# Patient Record
Sex: Female | Born: 1990 | Race: White | Hispanic: No | Marital: Married | Smoking: Never smoker
Health system: Southern US, Community
[De-identification: ages and names within clinical notes are randomized; demographics above are authoritative.]

## PROBLEM LIST (undated history)

## (undated) DIAGNOSIS — O139 Gestational [pregnancy-induced] hypertension without significant proteinuria, unspecified trimester: Secondary | ICD-10-CM

## (undated) HISTORY — DX: Gestational (pregnancy-induced) hypertension without significant proteinuria, unspecified trimester: O13.9

## (undated) HISTORY — PX: HIP ARTHROSCOPY W/ LABRAL REPAIR: SHX1750

## (undated) HISTORY — PX: RHINOPLASTY: SUR1284

---

## 2020-01-02 NOTE — L&D Delivery Note (Signed)
Delivery Note At 7:50 PM a viable female was delivered via Vaginal, Spontaneous (Presentation: Left Occiput Anterior).  APGAR: 9, 10; weight pending.   Placenta status: Spontaneous, Intact.  Cord: 3 vessels with the following complications:  none.  Cord pH: not sent She pushed two times to easily deliver a viable female. Moderate uterine atony noted and 1g TXA given with resolution of atony.   Anesthesia: Epidural Episiotomy: None Lacerations: 1st degree Suture Repair: 2.0 3.0 vicryl Est. Blood Loss (mL):  400cc   Mom to postpartum.  Baby to Couplet care / Skin to Skin.  Brandi Mckenzie 09/09/2020, 8:18 PM

## 2020-02-15 LAB — OB RESULTS CONSOLE ANTIBODY SCREEN: Antibody Screen: NEGATIVE

## 2020-02-15 LAB — OB RESULTS CONSOLE GC/CHLAMYDIA
Chlamydia: NEGATIVE
Gonorrhea: NEGATIVE

## 2020-02-15 LAB — OB RESULTS CONSOLE ABO/RH: RH Type: POSITIVE

## 2020-02-15 LAB — OB RESULTS CONSOLE RUBELLA ANTIBODY, IGM: Rubella: NON-IMMUNE/NOT IMMUNE

## 2020-02-15 LAB — OB RESULTS CONSOLE RPR: RPR: NONREACTIVE

## 2020-02-15 LAB — OB RESULTS CONSOLE HEPATITIS B SURFACE ANTIGEN: Hepatitis B Surface Ag: NEGATIVE

## 2020-02-15 LAB — OB RESULTS CONSOLE HIV ANTIBODY (ROUTINE TESTING): HIV: NONREACTIVE

## 2020-04-22 ENCOUNTER — Other Ambulatory Visit: Payer: Self-pay | Admitting: Obstetrics & Gynecology

## 2020-04-22 DIAGNOSIS — N6320 Unspecified lump in the left breast, unspecified quadrant: Secondary | ICD-10-CM

## 2020-05-18 ENCOUNTER — Ambulatory Visit
Admission: RE | Admit: 2020-05-18 | Discharge: 2020-05-18 | Disposition: A | Discharge status: Home / Self Care | Payer: Self-pay | Source: Ambulatory Visit | Attending: Obstetrics & Gynecology | Admitting: Obstetrics & Gynecology

## 2020-05-18 ENCOUNTER — Other Ambulatory Visit: Payer: Self-pay

## 2020-05-18 ENCOUNTER — Other Ambulatory Visit: Payer: Self-pay | Admitting: Obstetrics & Gynecology

## 2020-05-18 DIAGNOSIS — N6320 Unspecified lump in the left breast, unspecified quadrant: Secondary | ICD-10-CM

## 2020-08-22 ENCOUNTER — Other Ambulatory Visit: Payer: Self-pay

## 2020-08-22 ENCOUNTER — Encounter (HOSPITAL_COMMUNITY): Payer: Self-pay | Admitting: Obstetrics & Gynecology

## 2020-08-22 ENCOUNTER — Inpatient Hospital Stay (EMERGENCY_DEPARTMENT_HOSPITAL)
Admission: AD | Admit: 2020-08-22 | Discharge: 2020-08-22 | Disposition: A | Discharge status: Home / Self Care | Payer: BC Managed Care – PPO | Source: Home / Self Care | Attending: Obstetrics & Gynecology | Admitting: Obstetrics & Gynecology

## 2020-08-22 DIAGNOSIS — R030 Elevated blood-pressure reading, without diagnosis of hypertension: Secondary | ICD-10-CM | POA: Diagnosis not present

## 2020-08-22 DIAGNOSIS — Z3A35 35 weeks gestation of pregnancy: Secondary | ICD-10-CM | POA: Insufficient documentation

## 2020-08-22 DIAGNOSIS — O133 Gestational [pregnancy-induced] hypertension without significant proteinuria, third trimester: Secondary | ICD-10-CM | POA: Insufficient documentation

## 2020-08-22 DIAGNOSIS — Z881 Allergy status to other antibiotic agents status: Secondary | ICD-10-CM | POA: Insufficient documentation

## 2020-08-22 DIAGNOSIS — Z880 Allergy status to penicillin: Secondary | ICD-10-CM | POA: Insufficient documentation

## 2020-08-22 LAB — CBC
HCT: 34.7 % — ABNORMAL LOW (ref 36.0–46.0)
Hemoglobin: 11.9 g/dL — ABNORMAL LOW (ref 12.0–15.0)
MCH: 30.8 pg (ref 26.0–34.0)
MCHC: 34.3 g/dL (ref 30.0–36.0)
MCV: 89.9 fL (ref 80.0–100.0)
Platelets: 240 K/uL (ref 150–400)
RBC: 3.86 MIL/uL — ABNORMAL LOW (ref 3.87–5.11)
RDW: 13.1 % (ref 11.5–15.5)
WBC: 11 K/uL — ABNORMAL HIGH (ref 4.0–10.5)
nRBC: 0 % (ref 0.0–0.2)

## 2020-08-22 LAB — COMPREHENSIVE METABOLIC PANEL
ALT: 10 U/L (ref 0–44)
AST: 17 U/L (ref 15–41)
Albumin: 2.3 g/dL — ABNORMAL LOW (ref 3.5–5.0)
Alkaline Phosphatase: 76 U/L (ref 38–126)
Anion gap: 6 (ref 5–15)
BUN: 5 mg/dL — ABNORMAL LOW (ref 6–20)
CO2: 20 mmol/L — ABNORMAL LOW (ref 22–32)
Calcium: 8.5 mg/dL — ABNORMAL LOW (ref 8.9–10.3)
Chloride: 109 mmol/L (ref 98–111)
Creatinine, Ser: 0.62 mg/dL (ref 0.44–1.00)
GFR, Estimated: >60 mL/min (ref >60)
Glucose, Bld: 85 mg/dL (ref 70–99)
Potassium: 3.7 mmol/L (ref 3.5–5.1)
Sodium: 135 mmol/L (ref 135–145)
Total Bilirubin: 0.3 mg/dL (ref 0.3–1.2)
Total Protein: 5.6 g/dL — ABNORMAL LOW (ref 6.5–8.1)

## 2020-08-22 LAB — PROTEIN / CREATININE RATIO, URINE
Creatinine, Urine: 63.67 mg/dL
Total Protein, Urine: <6 mg/dL

## 2020-08-22 NOTE — MAU Provider Note (Signed)
History     CSN: [id number]  Arrival date and time: 08/22/20 1613   Event Date/Time   First Provider Initiated Contact with Patient 08/22/20 1731      Chief Complaint  Patient presents with   Headache   Hypertension   Joint Swelling   HPI This is a 30yo G1P0 at [redacted]w[redacted]d who was sent to MAU from the office due to elevated BP in the office of 158/108. Protein was negative in urine. No palliating or provoking factors.  Good fetal movement. No headache, vision changes.   OB History     Gravida  1   Para  0   Term  0   Preterm  0   AB  0   Living  0      SAB  0   IAB  0   Ectopic  0   Multiple  0   Live Births  0           History reviewed. No pertinent past medical history.  Past Surgical History:  Procedure Laterality Date   HIP ARTHROSCOPY W/ LABRAL REPAIR     RHINOPLASTY      History reviewed. No pertinent family history.  Social History   Tobacco Use   Smoking status: Never   Smokeless tobacco: Never  Vaping Use   Vaping Use: Never used  Substance Use Topics   Alcohol use: Never   Drug use: Never    Allergies:  Allergies  Allergen Reactions   Doxycycline Anaphylaxis   Amoxicillin Rash   Penicillins Rash    Medications Prior to Admission  Medication Sig Dispense Refill Last Dose   lactobacillus acidophilus (BACID) TABS tablet Take 2 tablets by mouth 3 (three) times daily.      Prenatal Vit-Fe Fumarate-FA (PRENATAL MULTIVITAMIN) TABS tablet Take by mouth daily at 12 noon.       Review of Systems  All other systems reviewed and are negative. Physical Exam   Blood pressure (!) 149/98, pulse 87, temperature 98 F (36.7 C), temperature source Oral, resp. rate 20, height 5\' 8"  (1.727 m), weight 89.2 kg, SpO2 98 %.  Patient Vitals for the past 24 hrs:  BP Temp Temp src Pulse Resp SpO2 Height Weight  08/22/20 1845 (!) 149/98 -- -- 87 -- 98 % -- --  08/22/20 1830 (!) 143/95 -- -- 72 -- 97 % -- --  08/22/20 1815 (!) 139/99 -- -- 80  -- 97 % -- --  08/22/20 1800 (!) 137/99 -- -- 76 -- 98 % -- --  08/22/20 1745 (!) 141/101 -- -- 76 -- 98 % -- --  08/22/20 1730 (!) 144/101 -- -- 70 -- 97 % -- --  08/22/20 1715 (!) 151/101 -- -- 70 -- 98 % -- --  08/22/20 1700 (!) 143/103 -- -- 70 -- 97 % -- --  08/22/20 1658 (!) 140/100 -- -- 70 -- -- -- --  08/22/20 1640 (!) 152/104 98 F (36.7 C) Oral 73 20 99 % 5\' 8"  (1.727 m) 89.2 kg     Physical Exam Vitals and nursing note reviewed.  Constitutional:      Appearance: She is well-developed.  HENT:     Head: Normocephalic and atraumatic.  Cardiovascular:     Rate and Rhythm: Normal rate and regular rhythm.     Heart sounds: Normal heart sounds. No murmur heard.   No friction rub. No gallop.  Pulmonary:     Effort: Pulmonary effort is normal. No respiratory distress.  Breath sounds: Normal breath sounds. No wheezing.  Abdominal:     General: Bowel sounds are normal.     Palpations: Abdomen is soft.  Neurological:     Mental Status: She is alert.  Psychiatric:        Mood and Affect: Mood normal. Mood is not anxious or depressed.        Speech: Speech normal.        Behavior: Behavior normal.   Results for orders placed or performed during the hospital encounter of 08/22/20 (from the past 24 hour(s))  Protein / creatinine ratio, urine     Status: None   Collection Time: 08/22/20  4:54 PM  Result Value Ref Range   Creatinine, Urine 63.67 mg/dL   Total Protein, Urine <6 mg/dL   Protein Creatinine Ratio        0.00 - 0.15 mg/mg[Cre]  CBC     Status: Abnormal   Collection Time: 08/22/20  5:08 PM  Result Value Ref Range   WBC 11.0 (H) 4.0 - 10.5 K/uL   RBC 3.86 (L) 3.87 - 5.11 MIL/uL   Hemoglobin 11.9 (L) 12.0 - 15.0 g/dL   HCT 53.2 (L) 99.2 - 42.6 %   MCV 89.9 80.0 - 100.0 fL   MCH 30.8 26.0 - 34.0 pg   MCHC 34.3 30.0 - 36.0 g/dL   RDW 83.4 19.6 - 22.2 %   Platelets 240 150 - 400 K/uL   nRBC 0.0 0.0 - 0.2 %  Comprehensive metabolic panel     Status: Abnormal    Collection Time: 08/22/20  5:08 PM  Result Value Ref Range   Sodium 135 135 - 145 mmol/L   Potassium 3.7 3.5 - 5.1 mmol/L   Chloride 109 98 - 111 mmol/L   CO2 20 (L) 22 - 32 mmol/L   Glucose, Bld 85 70 - 99 mg/dL   BUN 5 (L) 6 - 20 mg/dL   Creatinine, Ser 9.79 0.44 - 1.00 mg/dL   Calcium 8.5 (L) 8.9 - 10.3 mg/dL   Total Protein 5.6 (L) 6.5 - 8.1 g/dL   Albumin 2.3 (L) 3.5 - 5.0 g/dL   AST 17 15 - 41 U/L   ALT 10 0 - 44 U/L   Alkaline Phosphatase 76 38 - 126 U/L   Total Bilirubin 0.3 0.3 - 1.2 mg/dL   GFR, Estimated >89 >21 mL/min   Anion gap 6 5 - 15     MAU Course  Procedures NST: Baseline 130s, mod variability, + accels, no decels   MDM Bps have remained mild range. Labs normal. Stable for discharge to home. Spoke with Dr Langston Masker - they will arrange follow up with the patient.  Assessment and Plan   1. [redacted] weeks gestation of pregnancy   2. Gestational hypertension, third trimester    Discharge to home. Return precautions given, especially severe symptoms.  Levie Heritage 08/22/2020, 6:52 PM

## 2020-08-22 NOTE — MAU Note (Signed)
Noticed last wk or so, BP has been going up. Woke up this mornig 140's /90's. +HA, kind of dizzy and increase in swelling. In office,158/108. Sent in for further eval. Baby still moving- but less. +HA (has not taken anything),"little spotty" with vision,  denies epigastric pain, swollen ankles.

## 2020-08-24 ENCOUNTER — Inpatient Hospital Stay (HOSPITAL_COMMUNITY)
Admission: AD | Admit: 2020-08-24 | Discharge: 2020-08-26 | DRG: 833 | Disposition: A | Discharge status: Home / Self Care | Payer: BC Managed Care – PPO | Attending: Obstetrics and Gynecology | Admitting: Obstetrics and Gynecology

## 2020-08-24 ENCOUNTER — Encounter (HOSPITAL_COMMUNITY): Payer: Self-pay | Admitting: Obstetrics and Gynecology

## 2020-08-24 ENCOUNTER — Inpatient Hospital Stay (HOSPITAL_COMMUNITY): Payer: BC Managed Care – PPO

## 2020-08-24 ENCOUNTER — Other Ambulatory Visit: Payer: Self-pay

## 2020-08-24 DIAGNOSIS — Z20822 Contact with and (suspected) exposure to covid-19: Secondary | ICD-10-CM | POA: Diagnosis present

## 2020-08-24 DIAGNOSIS — O133 Gestational [pregnancy-induced] hypertension without significant proteinuria, third trimester: Principal | ICD-10-CM | POA: Diagnosis present

## 2020-08-24 DIAGNOSIS — O1413 Severe pre-eclampsia, third trimester: Secondary | ICD-10-CM | POA: Diagnosis not present

## 2020-08-24 DIAGNOSIS — O10013 Pre-existing essential hypertension complicating pregnancy, third trimester: Secondary | ICD-10-CM | POA: Diagnosis not present

## 2020-08-24 DIAGNOSIS — R030 Elevated blood-pressure reading, without diagnosis of hypertension: Secondary | ICD-10-CM | POA: Diagnosis present

## 2020-08-24 DIAGNOSIS — Z3A35 35 weeks gestation of pregnancy: Secondary | ICD-10-CM | POA: Diagnosis not present

## 2020-08-24 DIAGNOSIS — O1410 Severe pre-eclampsia, unspecified trimester: Secondary | ICD-10-CM | POA: Diagnosis present

## 2020-08-24 LAB — COMPREHENSIVE METABOLIC PANEL
ALT: 13 U/L (ref 0–44)
AST: 17 U/L (ref 15–41)
Albumin: 2.5 g/dL — ABNORMAL LOW (ref 3.5–5.0)
Alkaline Phosphatase: 77 U/L (ref 38–126)
Anion gap: 10 (ref 5–15)
BUN: 6 mg/dL (ref 6–20)
CO2: 18 mmol/L — ABNORMAL LOW (ref 22–32)
Calcium: 9.2 mg/dL (ref 8.9–10.3)
Chloride: 106 mmol/L (ref 98–111)
Creatinine, Ser: 0.64 mg/dL (ref 0.44–1.00)
GFR, Estimated: >60 mL/min (ref >60)
Glucose, Bld: 140 mg/dL — ABNORMAL HIGH (ref 70–99)
Potassium: 3.8 mmol/L (ref 3.5–5.1)
Sodium: 134 mmol/L — ABNORMAL LOW (ref 135–145)
Total Bilirubin: 0.4 mg/dL (ref 0.3–1.2)
Total Protein: 6 g/dL — ABNORMAL LOW (ref 6.5–8.1)

## 2020-08-24 LAB — TYPE AND SCREEN
ABO/RH(D): O POS
Antibody Screen: NEGATIVE

## 2020-08-24 LAB — CBC
HCT: 39.8 % (ref 36.0–46.0)
Hemoglobin: 13.5 g/dL (ref 12.0–15.0)
MCH: 30.8 pg (ref 26.0–34.0)
MCHC: 33.9 g/dL (ref 30.0–36.0)
MCV: 90.9 fL (ref 80.0–100.0)
Platelets: 255 10*3/uL (ref 150–400)
RBC: 4.38 MIL/uL (ref 3.87–5.11)
RDW: 13.4 % (ref 11.5–15.5)
WBC: 15 10*3/uL — ABNORMAL HIGH (ref 4.0–10.5)
nRBC: 0 % (ref 0.0–0.2)

## 2020-08-24 LAB — PROTEIN / CREATININE RATIO, URINE
Creatinine, Urine: 168.96 mg/dL
Protein Creatinine Ratio: 0.09 mg/mg{Cre} (ref 0.00–0.15)
Total Protein, Urine: 16 mg/dL

## 2020-08-24 LAB — OB RESULTS CONSOLE GBS: GBS: NEGATIVE

## 2020-08-24 LAB — SARS CORONAVIRUS 2 (TAT 6-24 HRS): SARS Coronavirus 2: NEGATIVE

## 2020-08-24 MED ORDER — PRENATAL MULTIVITAMIN CH
1.0000 | ORAL_TABLET | Freq: Every day | ORAL | Status: DC
  Administered 2020-08-24 – 2020-08-26 (×3): 1 via ORAL
  Filled 2020-08-24 (×3): qty 1

## 2020-08-24 MED ORDER — ZOLPIDEM TARTRATE 5 MG PO TABS: 5.0000 mg | ORAL_TABLET | Freq: Every evening | ORAL | Status: DC | PRN

## 2020-08-24 MED ORDER — DOCUSATE SODIUM 100 MG PO CAPS
100.0000 mg | ORAL_CAPSULE | Freq: Every day | ORAL | Status: DC
  Administered 2020-08-24 – 2020-08-26 (×3): 100 mg via ORAL
  Filled 2020-08-24 (×3): qty 1

## 2020-08-24 MED ORDER — CALCIUM CARBONATE ANTACID 500 MG PO CHEW
2.0000 | CHEWABLE_TABLET | ORAL | Status: DC | PRN
  Administered 2020-08-24 – 2020-08-26 (×5): 400 mg via ORAL
  Filled 2020-08-24 (×5): qty 2

## 2020-08-24 MED ORDER — LACTATED RINGERS IV BOLUS
500.0000 mL | Freq: Once | INTRAVENOUS | Status: AC
  Administered 2020-08-24: 500 mL via INTRAVENOUS

## 2020-08-24 MED ORDER — BETAMETHASONE SOD PHOS & ACET 6 (3-3) MG/ML IJ SUSP
12.0000 mg | INTRAMUSCULAR | Status: AC
  Administered 2020-08-24 – 2020-08-25 (×2): 12 mg via INTRAMUSCULAR
  Filled 2020-08-24: qty 5

## 2020-08-24 MED ORDER — ACETAMINOPHEN 325 MG PO TABS
650.0000 mg | ORAL_TABLET | ORAL | Status: DC | PRN
  Administered 2020-08-24: 650 mg via ORAL
  Filled 2020-08-24: qty 2

## 2020-08-24 MED ORDER — PRENATAL MULTIVITAMIN CH: 1.0000 | ORAL_TABLET | Freq: Every day | ORAL | Status: DC

## 2020-08-24 MED ORDER — LACTATED RINGERS IV SOLN: INTRAVENOUS | Status: DC

## 2020-08-24 NOTE — Progress Notes (Signed)
Patient without complaints. BP (!) 150/91 (BP Location: Right Arm)   Pulse 66   Temp 98.5 F (36.9 C) (Oral)   Resp 18   SpO2 99%  Results for orders placed or performed during the hospital encounter of 08/24/20 (from the past 24 hour(s))  Protein / creatinine ratio, urine     Status: None   Collection Time: 08/24/20  1:56 PM  Result Value Ref Range   Creatinine, Urine 168.96 mg/dL   Total Protein, Urine 16 mg/dL   Protein Creatinine Ratio 0.09 0.00 - 0.15 mg/mg[Cre]   Ultrasound pending  Will obtain cbc and cmet  Mfm consult ordered Steroid series started Continue inpatient monitoring for now

## 2020-08-24 NOTE — H&P (Signed)
Brandi Mckenzie is a 30 y.o. G 1 P 0 at 35 w 3 days presents for management of hypertension BP noted to be very elevated 2 days ago with 3 + proteinuria  Sent home and symptoms have worsened. OB History     Gravida  1   Para  0   Term  0   Preterm  0   AB  0   Living  0      SAB  0   IAB  0   Ectopic  0   Multiple  0   Live Births  0          No past medical history on file. Past Surgical History:  Procedure Laterality Date   HIP ARTHROSCOPY W/ LABRAL REPAIR     RHINOPLASTY     Family History: family history is not on file. Social History:  reports that she has never smoked. She has never used smokeless tobacco. She reports that she does not drink alcohol and does not use drugs.     Maternal Diabetes: No Genetic Screening: Normal Maternal Ultrasounds/Referrals: Normal Fetal Ultrasounds or other Referrals:  Referred to Materal Fetal Medicine  Maternal Substance Abuse:  No Significant Maternal Medications:  None Significant Maternal Lab Results:  None Other Comments:  None  Review of Systems  All other systems reviewed and are negative. History   There were no vitals taken for this visit. Exam Physical Exam Constitutional:      Appearance: Normal appearance.  HENT:     Head: Normocephalic.  Eyes:     Pupils: Pupils are equal, round, and reactive to light.  Cardiovascular:     Rate and Rhythm: Normal rate and regular rhythm.  Pulmonary:     Effort: Pulmonary effort is normal.  Musculoskeletal:     Cervical back: Normal range of motion.  Neurological:     Mental Status: She is alert.    Prenatal labs: ABO, Rh:   Antibody:   Rubella:   RPR:    HBsAg:    HIV:    GBS:     Assessment/Plan: IUP at 35 w 3 days Preeclampsia  Admit  Steroids MFM consult with ultrasound Labs   Jeani Hawking 08/24/2020, 12:28 PM

## 2020-08-25 DIAGNOSIS — Z3A35 35 weeks gestation of pregnancy: Secondary | ICD-10-CM | POA: Diagnosis not present

## 2020-08-25 DIAGNOSIS — O10013 Pre-existing essential hypertension complicating pregnancy, third trimester: Secondary | ICD-10-CM | POA: Diagnosis not present

## 2020-08-25 DIAGNOSIS — O1413 Severe pre-eclampsia, third trimester: Secondary | ICD-10-CM

## 2020-08-25 NOTE — Consult Note (Signed)
MFM Note  Brandi Mckenzie is a 30 year old gravida 1 para 0 currently at 35 weeks and 4 days.  She was admitted 2 days ago due to elevated blood pressures with 3+ proteinuria noted on urinalysis.  Her blood pressures on admission were all in the 140s to 150s over 100s range.  Her PIH labs were all within normal limits. Her P/C ratio was 0.09.  Other than some swelling, she denies any symptoms related to preeclampsia.  She does not have a history of hypertension.  Her fetal status has been reassuring with a reactive NST.  She reports feeling fetal movements throughout the day.  Due to suspected preeclampsia, she received a complete course of antenatal corticosteroids.  She had an ultrasound performed yesterday that showed an EFW of 5 pounds 13 ounces (43rd percentile). There was normal amniotic fluid with a total AFI of 16 cm.  The fetus was in the vertex presentation.  Since she has been on bedrest in the hospital, her blood pressures have been within normal limits in the 120s to 130s over 70s to 80s range.  She continues to remain asymptomatic.  She denies any significant past medical history.  Her past surgical history includes hip surgery and nose surgery-all related to sport injuries.  The implications and management of preeclampsia was discussed with the patient.  She was advised that preeclampsia can affect both the mother and the fetus.  In the mother, preeclampsia may cause a rise in blood pressures and it can affect the mother's kidney, liver and platelet functions.  It may also cause the mother to have seizures.  In the fetus, it may cause growth restriction and oligohydramnios.  She understands that delivery is the only treatment for preeclampsia.  The patient was advised that the diagnosis of preeclampsia remains uncertain as her P/C ratio did not indicate significant proteinuria.  However, she did have significant proteinuria based on the 3+ protein on her urinalysis.  As the diagnosis of  preeclampsia remains uncertain, we will collect a 24-hour urine today to determine if she meets the criteria for preeclampsia.  Should her 24-hour urine show 300 mg or more of protein, confirming the diagnosis of preeclampsia, delivery is recommended at her current gestational age.  Should her 24-hour urine show less than 300 mg of protein, indicating that she does not have preeclampsia but has gestational hypertension, delivery is recommended at around 37 weeks.  Should preeclampsia be ruled out, she may be discharged home with twice-weekly fetal testing and blood pressure checks in your office.  Her induction should be scheduled at 37 weeks.  The patient stated that all of her questions have been answered to her complete satisfaction.    Thank you for referring this patient for a Maternal-Fetal Medicine consultation.  Recommendations:  24-hour urine collection  Should the diagnosis of preeclampsia be confirmed, delivery is recommended  Should the diagnosis of preeclampsia be ruled out, outpatient management with twice-weekly fetal testing and blood pressure checks with delivery at 37 weeks

## 2020-08-25 NOTE — Progress Notes (Signed)
Antepartum Progress Note   Brandi Mckenzie is a 30 y.o. female G1P0 that is admitted to [facility] for preeclampsia.  Overnight, she reports no acute events.  Admits fetal movement, denies contractions, denies leakage of fluid, denies vaginal bleeding. She denies headache, vision changes, RUQ or epigastric pain.  History   Blood pressure 139/79, pulse 86, temperature 97.9 F (36.6 C), temperature source Oral, resp. rate 19, height 5\' 8"  (1.727 m), weight 88.5 kg, SpO2 99 %. Exam  Physical Exam: Gen: Alert, well appearing, no distress Chest: nonlabored breathing CV: no peripheral edema Abdomen: gravid, soft and nontender Ext: no evidence of DVT  FHT: reactive and reasssuring. Moderate variability. Accels present, no decels.   Assessment/Plan: Admitted to [facility] for preeclampsia BP normotensive to mild range, no antihypertensives required. PIH labs WNL. P/C ratio WNL.  OB [facility] read pending EFM and toco continuous BMZ 8/24, 8/25 Diet: reg DVT Ppx: SCDs    9/25 08/25/2020, 7:18 AM

## 2020-08-26 LAB — PROTEIN, URINE, 24 HOUR
Collection Interval-UPROT: 24 hours
Protein, 24H Urine: 240 mg/d — ABNORMAL HIGH (ref 50–100)
Protein, Urine: 6 mg/dL
Urine Total Volume-UPROT: 4000 mL

## 2020-08-26 LAB — CULTURE, BETA STREP (GROUP B ONLY)

## 2020-08-26 NOTE — Discharge Instructions (Signed)
Call MD for HA unrelieved by Tylenol, chest pain or shortness of breath, visual changes, or right upper quadrant abdominal pain.  Also, if decreased fetal movement of CTX.  Office will call on Monday to schedule appointment for Monday.

## 2020-08-26 NOTE — Progress Notes (Signed)
24 h urine has returned at 240mg .  Patient continues to have no pre-eclampsia symptoms.  NST reactive Will discharge home and have patient f/u twice weekly for the remainder of her pregnancy with planned IOL no later than 37 weeks.    , DO

## 2020-08-26 NOTE — Progress Notes (Addendum)
Antepartum Progress Note Patient denies HA, vision change, RUQ pain, CP/SOB.  Active FM.  Was able to sleep last night.     History Vitals:   08/26/20 0428 08/26/20 0755  BP: 124/79 138/75  Pulse: 65 76  Resp: 18 18  Temp: 98.1 F (36.7 C) 97.9 F (36.6 C)  SpO2: 97% 100%    Exam   Physical Exam: Gen: Alert, well appearing, no distress Chest: nonlabored breathing CV: 1+ edema b/l LE Abdomen: gravid, soft and nontender Ext: no evidence of DVT  U/S yesterday with appropriate fetal growth at 5#13 (43%), normal fluid (AFI 16).  Vertex presentation  FHT: reactive and reasssuring. Moderate variability. Accels present, no decels.     Assessment/Plan: 30yo G1 at [redacted]w[redacted]d with gestational hypertension  Admitted to [facility] for preeclampsia BP normotensive to mild range, no antihypertensives required. PIH labs WNL. P/C ratio WNL.  Awaiting results of 24 hour urine collection (up at 1400).  If >300 mg, will move to IOL.  If <300 mg, will discharge home for close outpatient management and delivery by 37 weeks. BMZ 8/24, 8/25 Diet: reg DVT Ppx: SCDs  Mitchel Honour, DO

## 2020-08-26 NOTE — Discharge Summary (Signed)
Physician Discharge Summary  Patient ID: Brandi Mckenzie MRN: [id number] DOB/AGE: 04-18-1990 30 y.o.  Admit date: 08/24/2020 Discharge date: 08/26/2020  Admission Diagnoses: Elevated blood pressure in third trimester of pregnancy  Discharge Diagnoses: Gestational hypertension  Discharged Condition: good  Hospital Course: Patient was admitted on 8/24 with newly elevated blood pressures and proteinuria.  She denied symptoms of pre-eclampsia.  Blood pressures were 140s to 150s over 100s range.  Her PIH labs were all within normal limits. Her P/C ratio was 0.09.  She received betamethasone but did not require anti-hypertensives.  U/S showed appropriate fetal growth at the 43% and normal fluid; fetal monitoring was reassuring during admission.  MFM consult was requested and Dr. Parke Poisson recommended 24 hour urine collection which returned at 240 mg.  Given dx of GHTN, patient was discharged home with twice weekly NST and follow up for the remainder of pregnancy and planned IOL by 37 weeks.    Consults:  MFM  Significant Diagnostic Studies: labs: 24 hour urine 240 mg  Treatments: BMZ  Discharge Exam: Blood pressure (!) 151/91, pulse (!) 54, temperature 98.6 F (37 C), resp. rate 18, height 5\' 8"  (1.727 m), weight 88 kg, SpO2 100 %. General appearance: alert, cooperative, and appears stated age Extremities: extremities normal, atraumatic, no cyanosis or edema Abd: gravid, soft and non-tender  Disposition: Discharge disposition: 01-Home or Self Care        Allergies as of 08/26/2020       Reactions   Doxycycline Anaphylaxis   Amoxicillin Rash   Penicillins Rash        Medication List     TAKE these medications    lactobacillus acidophilus Tabs tablet Take 2 tablets by mouth 3 (three) times daily.   prenatal multivitamin Tabs tablet Take by mouth daily at 12 noon.         Signed: 08/28/2020 08/26/2020, 5:35 PM

## 2020-08-29 ENCOUNTER — Inpatient Hospital Stay (HOSPITAL_COMMUNITY)
Admission: AD | Admit: 2020-08-29 | Discharge: 2020-08-29 | Disposition: A | Discharge status: Home / Self Care | Payer: BC Managed Care – PPO | Attending: Obstetrics and Gynecology | Admitting: Obstetrics and Gynecology

## 2020-08-29 ENCOUNTER — Other Ambulatory Visit: Payer: Self-pay

## 2020-08-29 ENCOUNTER — Inpatient Hospital Stay (HOSPITAL_BASED_OUTPATIENT_CLINIC_OR_DEPARTMENT_OTHER): Payer: BC Managed Care – PPO

## 2020-08-29 ENCOUNTER — Encounter (HOSPITAL_COMMUNITY): Payer: Self-pay | Admitting: Obstetrics and Gynecology

## 2020-08-29 DIAGNOSIS — O1413 Severe pre-eclampsia, third trimester: Secondary | ICD-10-CM

## 2020-08-29 DIAGNOSIS — O26893 Other specified pregnancy related conditions, third trimester: Secondary | ICD-10-CM | POA: Insufficient documentation

## 2020-08-29 DIAGNOSIS — Z79899 Other long term (current) drug therapy: Secondary | ICD-10-CM | POA: Insufficient documentation

## 2020-08-29 DIAGNOSIS — O139 Gestational [pregnancy-induced] hypertension without significant proteinuria, unspecified trimester: Secondary | ICD-10-CM

## 2020-08-29 DIAGNOSIS — R519 Headache, unspecified: Secondary | ICD-10-CM | POA: Insufficient documentation

## 2020-08-29 DIAGNOSIS — Z3A36 36 weeks gestation of pregnancy: Secondary | ICD-10-CM

## 2020-08-29 DIAGNOSIS — O133 Gestational [pregnancy-induced] hypertension without significant proteinuria, third trimester: Secondary | ICD-10-CM | POA: Diagnosis not present

## 2020-08-29 LAB — PROTEIN / CREATININE RATIO, URINE
Creatinine, Urine: 105.3 mg/dL
Protein Creatinine Ratio: 0.09 mg/mg{Cre} (ref 0.00–0.15)
Total Protein, Urine: 10 mg/dL

## 2020-08-29 LAB — URINALYSIS, ROUTINE W REFLEX MICROSCOPIC
Bilirubin Urine: NEGATIVE
Glucose, UA: NEGATIVE mg/dL
Hgb urine dipstick: NEGATIVE
Ketones, ur: NEGATIVE mg/dL
Leukocytes,Ua: NEGATIVE
Nitrite: NEGATIVE
Protein, ur: NEGATIVE mg/dL
Specific Gravity, Urine: 1.015 (ref 1.005–1.030)
pH: 7 (ref 5.0–8.0)

## 2020-08-29 LAB — COMPREHENSIVE METABOLIC PANEL
ALT: 14 U/L (ref 0–44)
AST: 20 U/L (ref 15–41)
Albumin: 2.4 g/dL — ABNORMAL LOW (ref 3.5–5.0)
Alkaline Phosphatase: 84 U/L (ref 38–126)
Anion gap: 9 (ref 5–15)
BUN: 7 mg/dL (ref 6–20)
CO2: 18 mmol/L — ABNORMAL LOW (ref 22–32)
Calcium: 8.6 mg/dL — ABNORMAL LOW (ref 8.9–10.3)
Chloride: 108 mmol/L (ref 98–111)
Creatinine, Ser: 0.7 mg/dL (ref 0.44–1.00)
GFR, Estimated: >60 mL/min (ref >60)
Glucose, Bld: 130 mg/dL — ABNORMAL HIGH (ref 70–99)
Potassium: 3.8 mmol/L (ref 3.5–5.1)
Sodium: 135 mmol/L (ref 135–145)
Total Bilirubin: 0.3 mg/dL (ref 0.3–1.2)
Total Protein: 6 g/dL — ABNORMAL LOW (ref 6.5–8.1)

## 2020-08-29 LAB — CBC
HCT: 36.4 % (ref 36.0–46.0)
Hemoglobin: 12.4 g/dL (ref 12.0–15.0)
MCH: 31.1 pg (ref 26.0–34.0)
MCHC: 34.1 g/dL (ref 30.0–36.0)
MCV: 91.2 fL (ref 80.0–100.0)
Platelets: 235 10*3/uL (ref 150–400)
RBC: 3.99 MIL/uL (ref 3.87–5.11)
RDW: 13.6 % (ref 11.5–15.5)
WBC: 14.5 10*3/uL — ABNORMAL HIGH (ref 4.0–10.5)
nRBC: 0 % (ref 0.0–0.2)

## 2020-08-29 IMAGING — US US MFM FETAL BPP W/O NON-STRESS
1 series · 12 of 28 positions shown · non-contrast
Comparison: none

[Series 1: us mfm fetal bpp w/o non-stress · 38 acquisitions, 12 frames shown]
[im 2/38]
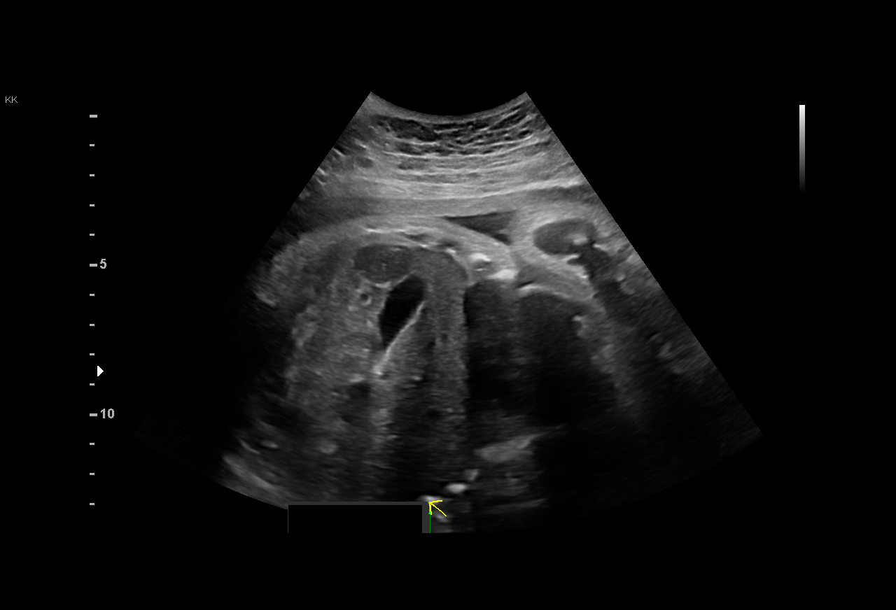
[im 5/38]
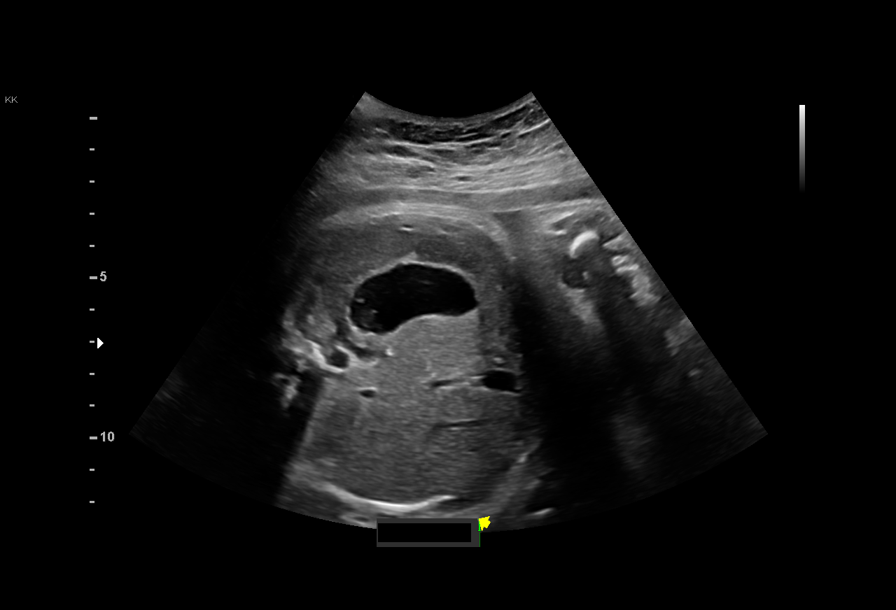
[im 7/38]
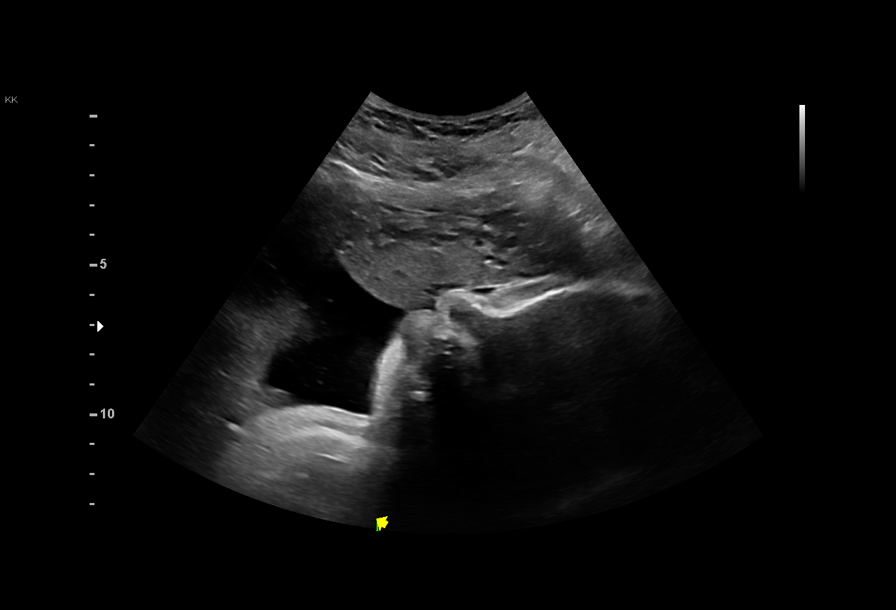
[im 11/38]
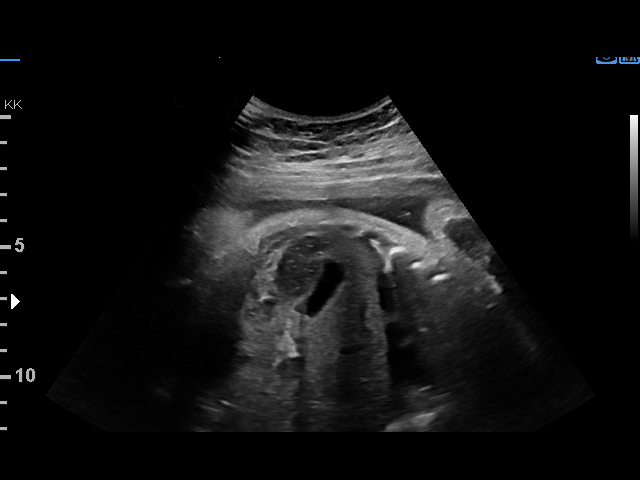
[im 14/38]
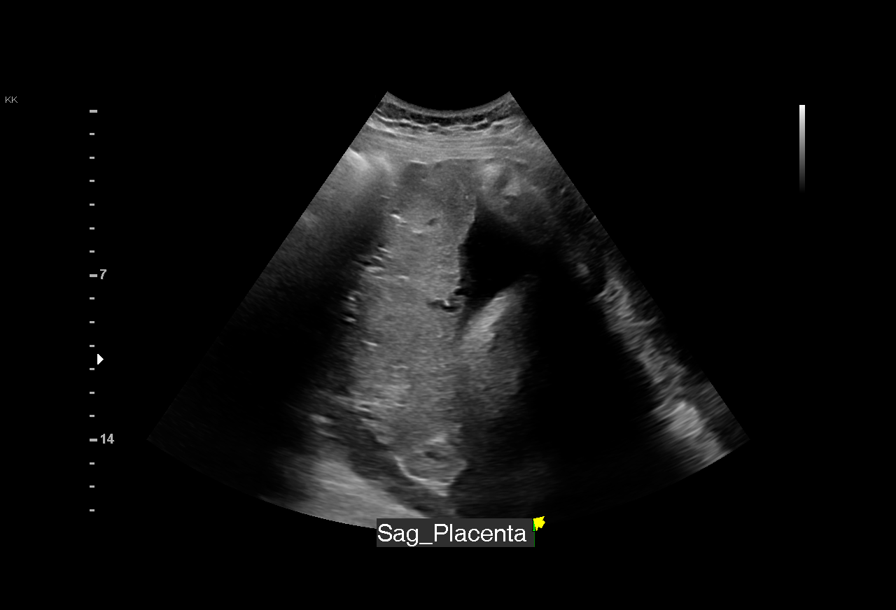
[im 17/38]
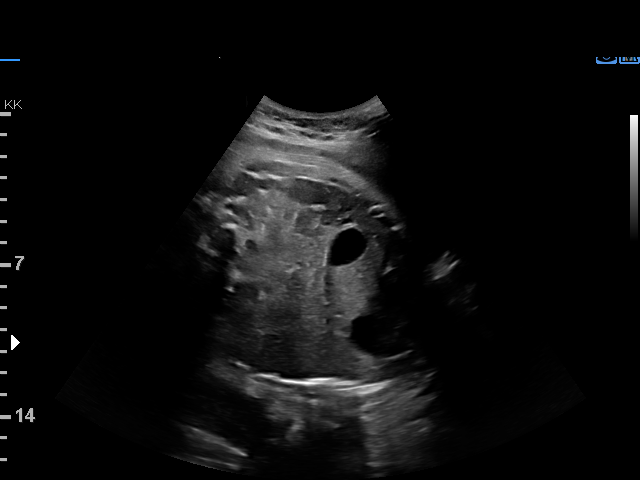
[im 21/38]
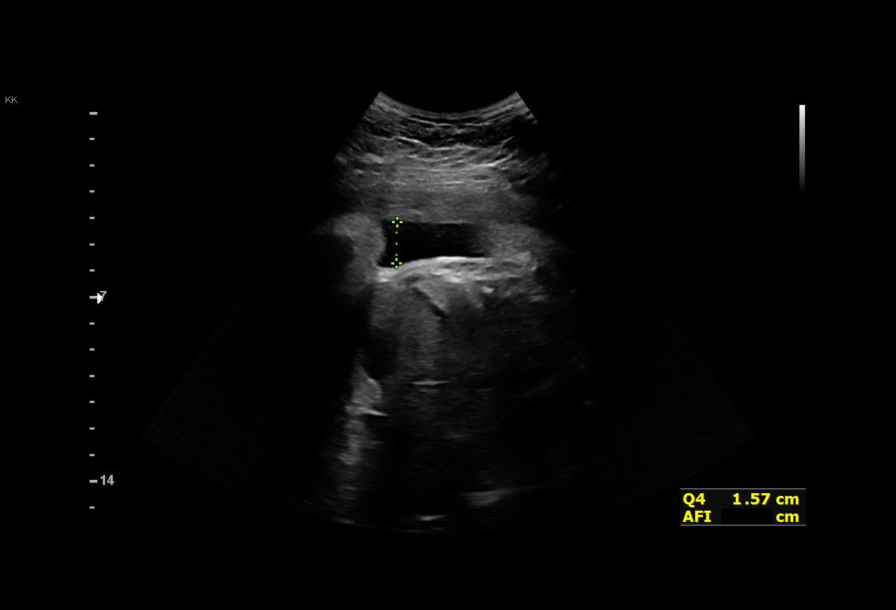
[im 24/38]
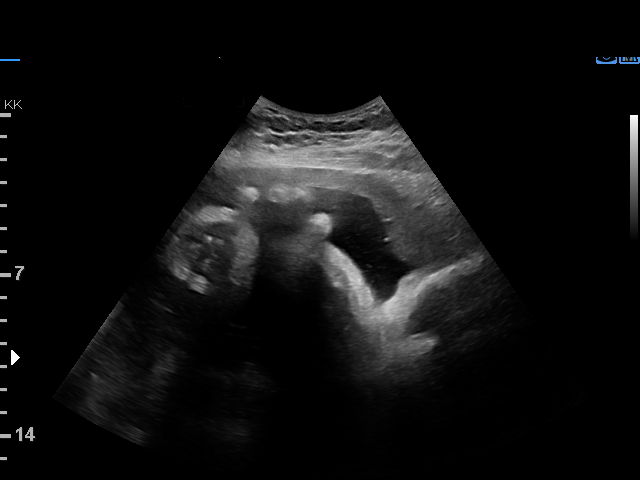
[im 27/38]
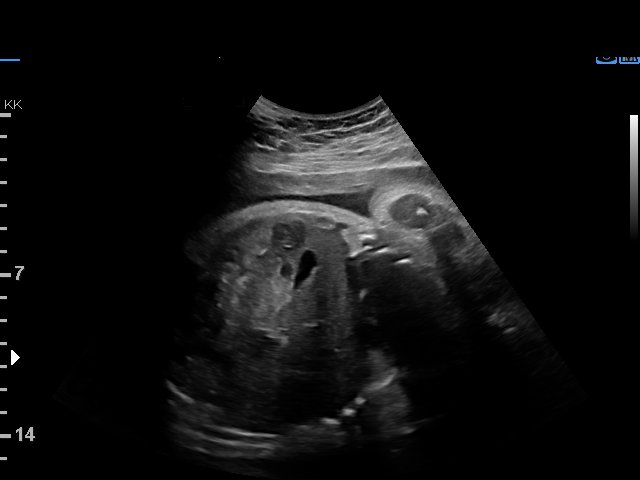
[im 31/38]
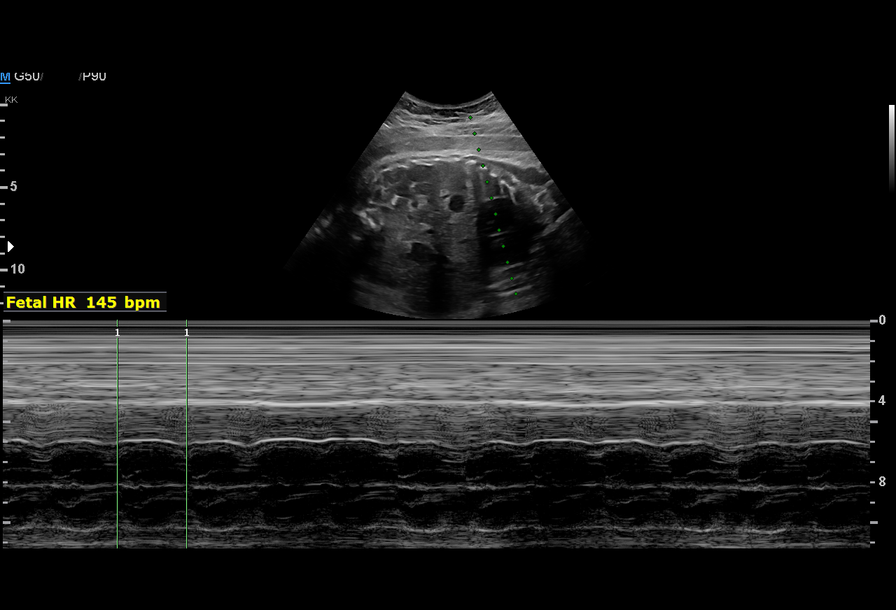
[im 33/38]
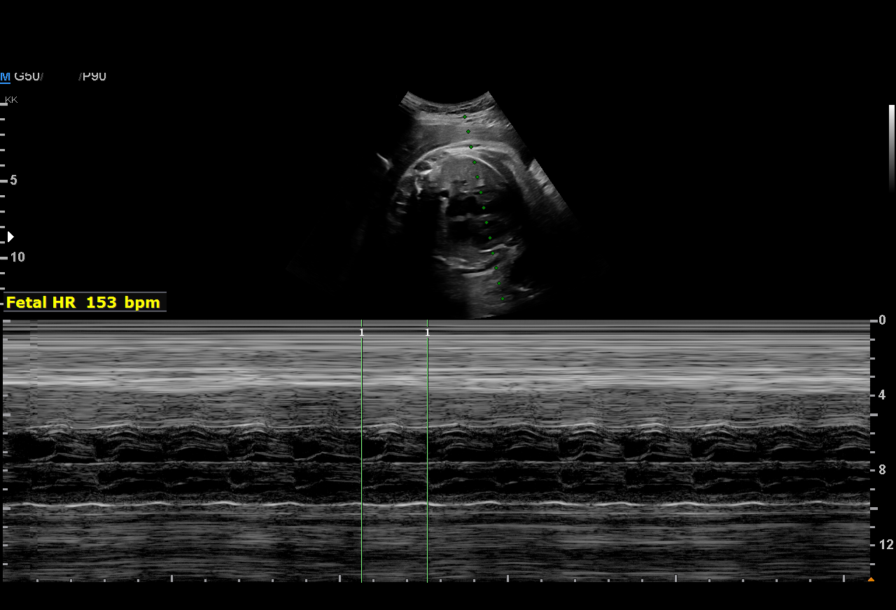
[im 36/38]
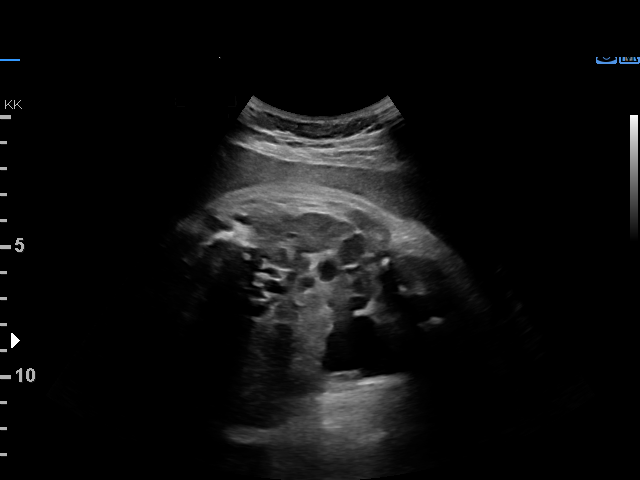

[12 of 28 positions shown; findings below may reference images not displayed]

#300

Indications

 Severe preeclampsia, third trimester           [04]
 36 weeks gestation of pregnancy
Fetal Evaluation

 Num Of Fetuses:         1
 Fetal Heart Rate(bpm):  166
 Cardiac Activity:       Observed
 Presentation:           Cephalic
 Placenta:               Left lateral

 Amniotic Fluid
 AFI FV:      Within normal limits

 AFI Sum(cm)     %Tile       Largest Pocket(cm)
 14.5            53

 RUQ(cm)       RLQ(cm)       LUQ(cm)        LLQ(cm)
 3.5           1.6           3
Biophysical Evaluation

 Amniotic F.V:   Pocket => 2 cm             F. Tone:        Observed
 F. Movement:    Observed                   Score:          [DATE]
 F. Breathing:   Observed
Gestational Age
 Clinical EDD:  36w 1d                                        EDD:   [DATE]
 Best:          36w 1d     Det. By:  Clinical EDD             EDD:   [DATE]
Anatomy

 Stomach:               Visualized             Bladder:                Visualized
Comments

 This patient was sent from the office to the [facility] due to
 elevated blood pressures.
 A biophysical profile performed today was [DATE].
 There was normal amniotic fluid noted on today's ultrasound
 exam.

## 2020-08-29 MED ORDER — LABETALOL HCL 5 MG/ML IV SOLN: 40.0000 mg | INTRAVENOUS | Status: DC | PRN

## 2020-08-29 MED ORDER — ACETAMINOPHEN 500 MG PO TABS
1000.0000 mg | ORAL_TABLET | Freq: Once | ORAL | Status: AC
  Administered 2020-08-29: 1000 mg via ORAL
  Filled 2020-08-29: qty 2

## 2020-08-29 MED ORDER — LABETALOL HCL 5 MG/ML IV SOLN: 20.0000 mg | INTRAVENOUS | Status: DC | PRN

## 2020-08-29 MED ORDER — LABETALOL HCL 5 MG/ML IV SOLN: 80.0000 mg | INTRAVENOUS | Status: DC | PRN

## 2020-08-29 MED ORDER — HYDRALAZINE HCL 20 MG/ML IJ SOLN
10.0000 mg | INTRAMUSCULAR | Status: DC | PRN
Start: 2020-08-29 — End: 2020-08-29

## 2020-08-29 NOTE — Discharge Instructions (Signed)
Be seen in the office on Wednesday for BP check Return with any worsening symptoms - headache, Right upper quadrant pain, decreased fetal movement, or swelling

## 2020-08-29 NOTE — MAU Provider Note (Signed)
History     CSN: [id number]  Arrival date and time: 08/29/20 1258   Event Date/Time   First Provider Initiated Contact with Patient 08/29/20 1327      CC:  Headache and elevated BP - sent from the office  HPI Brandi Mckenzie 30 y.o. [redacted]w[redacted]d Sent from the office today due to elevated BP.  Was admitted for hypertension in pregnancy and was discharged last week.  Today was at the office for a BP check and BP was elevated.  Has a headache today but has not taken any tylenol for headache today.   OB History     Gravida  1   Para  0   Term  0   Preterm  0   AB  0   Living  0      SAB  0   IAB  0   Ectopic  0   Multiple  0   Live Births  0           History reviewed. No pertinent past medical history.  Past Surgical History:  Procedure Laterality Date   HIP ARTHROSCOPY W/ LABRAL REPAIR     RHINOPLASTY      History reviewed. No pertinent family history.  Social History   Tobacco Use   Smoking status: Never   Smokeless tobacco: Never  Vaping Use   Vaping Use: Never used  Substance Use Topics   Alcohol use: Never   Drug use: Never    Allergies:  Allergies  Allergen Reactions   Doxycycline Anaphylaxis   Amoxicillin Rash   Penicillins Rash    Medications Prior to Admission  Medication Sig Dispense Refill Last Dose   lactobacillus acidophilus (BACID) TABS tablet Take 2 tablets by mouth 3 (three) times daily.      Prenatal Vit-Fe Fumarate-FA (PRENATAL MULTIVITAMIN) TABS tablet Take by mouth daily at 12 noon.       Review of Systems  Constitutional:  Negative for fever.  Respiratory:  Negative for shortness of breath and wheezing.   Gastrointestinal:  Negative for abdominal pain.  Genitourinary:  Negative for dysuria, vaginal bleeding and vaginal discharge.  Neurological:  Positive for headaches. Negative for dizziness.  Physical Exam   Blood pressure (!) 156/98, pulse 94, temperature 98.4 F (36.9 C), temperature source Oral, resp. rate 18,  height 5\' 8"  (1.727 m), weight 88 kg, SpO2 98 %.  Physical Exam Vitals and nursing note reviewed.  Constitutional:      Appearance: She is well-developed.  HENT:     Head: Normocephalic.  Cardiovascular:     Rate and Rhythm: Regular rhythm.  Pulmonary:     Effort: Pulmonary effort is normal.  Abdominal:     Palpations: Abdomen is soft.     Tenderness: There is no abdominal tenderness. There is no guarding or rebound.     Comments: No RUQ pain  FHT baseline 155 with moderate variability and 15x15 accels noted One deceleration noted with quick return to baseline. No contractions. Reactive NST  Musculoskeletal:        General: Normal range of motion.     Cervical back: Neck supple.     Comments: Trace edema in ankles Hands are slightly swollen  Skin:    General: Skin is warm and dry.  Neurological:     Mental Status: She is alert and oriented to person, place, and time.  Psychiatric:        Mood and Affect: Mood normal.  Behavior: Behavior normal.        Thought Content: Thought content normal.   Vitals:   08/29/20 1431 08/29/20 1445 08/29/20 1501 08/29/20 1516  BP: (!) 152/98 (!) 154/101 (!) 154/99 (!) 147/100  Pulse: 100 (!) 106 (!) 104 (!) 103  Temp:      Resp:      Height:      Weight:      SpO2:  97%    TempSrc:      BMI (Calculated):        MAU Course  Procedures LABS Results for orders placed or performed during the hospital encounter of 08/29/20 (from the past 24 hour(s))  Urinalysis, Routine w reflex microscopic Urine, Clean Catch     Status: None   Collection Time: 08/29/20  1:13 PM  Result Value Ref Range   Color, Urine YELLOW YELLOW   APPearance CLEAR CLEAR   Specific Gravity, Urine 1.015 1.005 - 1.030   pH 7.0 5.0 - 8.0   Glucose, UA NEGATIVE NEGATIVE mg/dL   Hgb urine dipstick NEGATIVE NEGATIVE   Bilirubin Urine NEGATIVE NEGATIVE   Ketones, ur NEGATIVE NEGATIVE mg/dL   Protein, ur NEGATIVE NEGATIVE mg/dL   Nitrite NEGATIVE NEGATIVE    Leukocytes,Ua NEGATIVE NEGATIVE  CBC     Status: Abnormal   Collection Time: 08/29/20  1:30 PM  Result Value Ref Range   WBC 14.5 (H) 4.0 - 10.5 K/uL   RBC 3.99 3.87 - 5.11 MIL/uL   Hemoglobin 12.4 12.0 - 15.0 g/dL   HCT 25.0 03.7 - 04.8 %   MCV 91.2 80.0 - 100.0 fL   MCH 31.1 26.0 - 34.0 pg   MCHC 34.1 30.0 - 36.0 g/dL   RDW 88.9 16.9 - 45.0 %   Platelets 235 150 - 400 K/uL   nRBC 0.0 0.0 - 0.2 %  Comprehensive metabolic panel     Status: Abnormal   Collection Time: 08/29/20  1:30 PM  Result Value Ref Range   Sodium 135 135 - 145 mmol/L   Potassium 3.8 3.5 - 5.1 mmol/L   Chloride 108 98 - 111 mmol/L   CO2 18 (L) 22 - 32 mmol/L   Glucose, Bld 130 (H) 70 - 99 mg/dL   BUN 7 6 - 20 mg/dL   Creatinine, Ser 3.88 0.44 - 1.00 mg/dL   Calcium 8.6 (L) 8.9 - 10.3 mg/dL   Total Protein 6.0 (L) 6.5 - 8.1 g/dL   Albumin 2.4 (L) 3.5 - 5.0 g/dL   AST 20 15 - 41 U/L   ALT 14 0 - 44 U/L   Alkaline Phosphatase 84 38 - 126 U/L   Total Bilirubin 0.3 0.3 - 1.2 mg/dL   GFR, Estimated >82 >80 mL/min   Anion gap 9 5 - 15  Protein / creatinine ratio, urine     Status: None   Collection Time: 08/29/20  1:37 PM  Result Value Ref Range   Creatinine, Urine 105.30 mg/dL   Total Protein, Urine 10 mg/dL   Protein Creatinine Ratio 0.09 0.00 - 0.15 mg/mg[Cre]     MDM One severe range BP on arrival to MAU.  Other BPs elevated but not severe range. Had betamethasone 8/24 and 8/25 Tylenol PO given for headache today and headache lessened to almost gone Labs for pre-eclampsia normal Had one deceleration on strip and Dr. Adrian Blackwater recommended BPP which was 8/8. Reviewed plan with Dr. Adrian Blackwater who recommended discharge with close follow up. Reviewed plan with Dr. Idalia Needle by phone -  agrees with plan - to see patient in office on Wednesday.   Assessment and Plan  Gestational hypertension with one severe range pressure  Normal labs - no preeclampsia Reactive NST  Plan Will discharge Be seen in the  office on Wednesday for BP check Reviewed signs to return - worse headache (almost resolved today), RUQ pain, worsening edema - trace ankle edema today, or severe range pressure at home 160/110 either value.  Cecelia Graciano L Anirudh Baiz 08/29/2020, 1:51 PM

## 2020-08-29 NOTE — MAU Note (Signed)
Brandi Mckenzie is a 30 y.o. at [redacted]w[redacted]d here in MAU reporting: was sent over from the office due to elevated BP and HA. Feels like she can't catch her breath. +FM.  Onset of complaint: today  Pain score: 5/10  Vitals:   08/29/20 1314  BP: (!) 160/108  Pulse: (!) 117  Resp: 18  Temp: 98.4 F (36.9 C)  SpO2: 98%     FHT:156  Lab orders placed from triage: UA

## 2020-09-02 ENCOUNTER — Telehealth (HOSPITAL_COMMUNITY): Payer: Self-pay | Admitting: *Deleted

## 2020-09-02 ENCOUNTER — Encounter (HOSPITAL_COMMUNITY): Payer: Self-pay | Admitting: *Deleted

## 2020-09-02 NOTE — Telephone Encounter (Signed)
Preadmission screen  

## 2020-09-08 ENCOUNTER — Other Ambulatory Visit: Payer: Self-pay

## 2020-09-08 NOTE — H&P (Signed)
Brandi Mckenzie is a 30 y.o. female presenting for IOL s/s gestation hypertension without severe features. She was diagnosed at 36 weeks, admitted, given BMZ, and discharged in stable condition. She has been monitored without medication and with reassuring antenatal testing as an outpatient until now.  [place] labs have been reassuring. She has used adderall this pregnancy and has reassuring growth.   OB History     Gravida  1   Para  0   Term  0   Preterm  0   AB  0   Living  0      SAB  0   IAB  0   Ectopic  0   Multiple  0   Live Births  0          Past Medical History:  Diagnosis Date   Pregnancy induced hypertension    Past Surgical History:  Procedure Laterality Date   HIP ARTHROSCOPY W/ LABRAL REPAIR     RHINOPLASTY     Family History: family history is not on file. Social History:  reports that she has never smoked. She has never used smokeless tobacco. She reports that she does not drink alcohol and does not use drugs.     Maternal Diabetes: No Genetic Screening: Normal Maternal Ultrasounds/Referrals: Normal Fetal Ultrasounds or other Referrals:  None Maternal Substance Abuse:  No Significant Maternal Medications:  Meds include: Other:  Adderral Significant Maternal Lab Results:  Group B Strep negative Other Comments:  None  Review of Systems History   There were no vitals taken for this visit. Exam Physical Exam  (from office) NAD, A&O NWOB Abd soft, nondistended, gravid  Prenatal labs: ABO, Rh: --/--/O POS (08/24 1745) Antibody: NEG (08/24 1745) Rubella: Nonimmune (02/14 0000) RPR: Nonreactive (02/14 0000)  HBsAg: Negative (02/14 0000)  HIV: Non-reactive (02/14 0000)  GBS:   Negative on 08/24/20  Assessment/Plan: 30 yo G1P0 @ 37.5wga presenting for IOL s/s gHTN. Has declined SVE since 56 wga but at that time was unfavorable. Plan for cytotec followed by pitocin/AROM when more favorable.  Mag prn severe features. GBS negative.  Rubella  non-immune - needs MMR pp.    Colin [place] Mackie 09/08/2020, 1:26 PM

## 2020-09-09 ENCOUNTER — Inpatient Hospital Stay (HOSPITAL_COMMUNITY): Payer: BC Managed Care – PPO | Admitting: Anesthesiology

## 2020-09-09 ENCOUNTER — Encounter (HOSPITAL_COMMUNITY): Payer: Self-pay | Admitting: Obstetrics and Gynecology

## 2020-09-09 ENCOUNTER — Inpatient Hospital Stay (HOSPITAL_COMMUNITY): Payer: BC Managed Care – PPO

## 2020-09-09 ENCOUNTER — Inpatient Hospital Stay (HOSPITAL_COMMUNITY)
Admission: AD | Admit: 2020-09-09 | Discharge: 2020-09-11 | DRG: 807 | Disposition: A | Discharge status: Home / Self Care | Payer: BC Managed Care – PPO | Attending: Obstetrics and Gynecology | Admitting: Obstetrics and Gynecology

## 2020-09-09 DIAGNOSIS — O134 Gestational [pregnancy-induced] hypertension without significant proteinuria, complicating childbirth: Principal | ICD-10-CM | POA: Diagnosis present

## 2020-09-09 DIAGNOSIS — Z20822 Contact with and (suspected) exposure to covid-19: Secondary | ICD-10-CM | POA: Diagnosis present

## 2020-09-09 DIAGNOSIS — Z3A37 37 weeks gestation of pregnancy: Secondary | ICD-10-CM | POA: Diagnosis not present

## 2020-09-09 DIAGNOSIS — O139 Gestational [pregnancy-induced] hypertension without significant proteinuria, unspecified trimester: Secondary | ICD-10-CM | POA: Diagnosis present

## 2020-09-09 LAB — COMPREHENSIVE METABOLIC PANEL
ALT: 13 U/L (ref 0–44)
AST: 18 U/L (ref 15–41)
Albumin: 2.4 g/dL — ABNORMAL LOW (ref 3.5–5.0)
Alkaline Phosphatase: 90 U/L (ref 38–126)
Anion gap: 9 (ref 5–15)
BUN: 12 mg/dL (ref 6–20)
CO2: 19 mmol/L — ABNORMAL LOW (ref 22–32)
Calcium: 9.9 mg/dL (ref 8.9–10.3)
Chloride: 104 mmol/L (ref 98–111)
Creatinine, Ser: 0.62 mg/dL (ref 0.44–1.00)
GFR, Estimated: >60 mL/min (ref >60)
Glucose, Bld: 80 mg/dL (ref 70–99)
Potassium: 4.4 mmol/L (ref 3.5–5.1)
Sodium: 132 mmol/L — ABNORMAL LOW (ref 135–145)
Total Bilirubin: 0.4 mg/dL (ref 0.3–1.2)
Total Protein: 5.7 g/dL — ABNORMAL LOW (ref 6.5–8.1)

## 2020-09-09 LAB — CBC
HCT: 38.2 % (ref 36.0–46.0)
HCT: 40.4 % (ref 36.0–46.0)
Hemoglobin: 13 g/dL (ref 12.0–15.0)
Hemoglobin: 13.4 g/dL (ref 12.0–15.0)
MCH: 31 pg (ref 26.0–34.0)
MCH: 30.5 pg (ref 26.0–34.0)
MCHC: 34 g/dL (ref 30.0–36.0)
MCHC: 33.2 g/dL (ref 30.0–36.0)
MCV: 91 fL (ref 80.0–100.0)
MCV: 92 fL (ref 80.0–100.0)
Platelets: 277 10*3/uL (ref 150–400)
Platelets: 290 10*3/uL (ref 150–400)
RBC: 4.2 MIL/uL (ref 3.87–5.11)
RBC: 4.39 MIL/uL (ref 3.87–5.11)
RDW: 12.9 % (ref 11.5–15.5)
RDW: 12.9 % (ref 11.5–15.5)
WBC: 12.9 10*3/uL — ABNORMAL HIGH (ref 4.0–10.5)
WBC: 16.2 10*3/uL — ABNORMAL HIGH (ref 4.0–10.5)
nRBC: 0 % (ref 0.0–0.2)
nRBC: 0 % (ref 0.0–0.2)

## 2020-09-09 LAB — RPR: RPR Ser Ql: NONREACTIVE

## 2020-09-09 LAB — TYPE AND SCREEN
ABO/RH(D): O POS
Antibody Screen: NEGATIVE

## 2020-09-09 LAB — RESP PANEL BY RT-PCR (FLU A&B, COVID) ARPGX2
Influenza A by PCR: NEGATIVE
Influenza B by PCR: NEGATIVE
SARS Coronavirus 2 by RT PCR: POSITIVE — AB

## 2020-09-09 MED ORDER — LACTATED RINGERS IV SOLN
500.0000 mL | Freq: Once | INTRAVENOUS | Status: AC
  Administered 2020-09-09: 500 mL via INTRAVENOUS

## 2020-09-09 MED ORDER — OXYTOCIN BOLUS FROM INFUSION: 333.0000 mL | Freq: Once | INTRAVENOUS | Status: DC

## 2020-09-09 MED ORDER — OXYTOCIN-SODIUM CHLORIDE 30-0.9 UT/500ML-% IV SOLN
2.5000 [IU]/h | INTRAVENOUS | Status: DC
  Filled 2020-09-09: qty 500

## 2020-09-09 MED ORDER — PHENYLEPHRINE 40 MCG/ML (10ML) SYRINGE FOR IV PUSH (FOR BLOOD PRESSURE SUPPORT): 80.0000 ug | PREFILLED_SYRINGE | INTRAVENOUS | Status: DC | PRN

## 2020-09-09 MED ORDER — MISOPROSTOL 25 MCG QUARTER TABLET
25.0000 ug | ORAL_TABLET | ORAL | Status: DC | PRN
  Administered 2020-09-09 (×3): 25 ug via VAGINAL
  Filled 2020-09-09 (×4): qty 1

## 2020-09-09 MED ORDER — HYDROXYZINE HCL 50 MG PO TABS: 50.0000 mg | ORAL_TABLET | Freq: Four times a day (QID) | ORAL | Status: DC | PRN

## 2020-09-09 MED ORDER — ONDANSETRON HCL 4 MG PO TABS: 4.0000 mg | ORAL_TABLET | ORAL | Status: DC | PRN

## 2020-09-09 MED ORDER — EPHEDRINE 5 MG/ML INJ: 10.0000 mg | INTRAVENOUS | Status: DC | PRN

## 2020-09-09 MED ORDER — LIDOCAINE HCL (PF) 1 % IJ SOLN
INTRAMUSCULAR | Status: DC | PRN
  Administered 2020-09-09: 5 mL via EPIDURAL
  Administered 2020-09-09: 4 mL via EPIDURAL

## 2020-09-09 MED ORDER — LACTATED RINGERS IV SOLN: INTRAVENOUS | Status: DC

## 2020-09-09 MED ORDER — ONDANSETRON HCL 4 MG/2ML IJ SOLN: 4.0000 mg | Freq: Four times a day (QID) | INTRAMUSCULAR | Status: DC | PRN

## 2020-09-09 MED ORDER — MISOPROSTOL 50MCG HALF TABLET
50.0000 ug | ORAL_TABLET | ORAL | Status: DC
  Administered 2020-09-09: 50 ug via ORAL

## 2020-09-09 MED ORDER — IBUPROFEN 600 MG PO TABS
600.0000 mg | ORAL_TABLET | Freq: Four times a day (QID) | ORAL | Status: DC
  Administered 2020-09-09 – 2020-09-11 (×6): 600 mg via ORAL
  Filled 2020-09-09 (×6): qty 1

## 2020-09-09 MED ORDER — BUTORPHANOL TARTRATE 1 MG/ML IJ SOLN
1.0000 mg | INTRAMUSCULAR | Status: DC | PRN
  Administered 2020-09-09: 1 mg via INTRAVENOUS
  Filled 2020-09-09: qty 1

## 2020-09-09 MED ORDER — TERBUTALINE SULFATE 1 MG/ML IJ SOLN: 0.2500 mg | Freq: Once | INTRAMUSCULAR | Status: DC | PRN

## 2020-09-09 MED ORDER — ACETAMINOPHEN 325 MG PO TABS
650.0000 mg | ORAL_TABLET | ORAL | Status: DC | PRN
  Administered 2020-09-10: 650 mg via ORAL
  Filled 2020-09-09: qty 2

## 2020-09-09 MED ORDER — OXYCODONE-ACETAMINOPHEN 5-325 MG PO TABS
1.0000 | ORAL_TABLET | ORAL | Status: DC | PRN
Start: 2020-09-09 — End: 2020-09-09

## 2020-09-09 MED ORDER — DIBUCAINE (PERIANAL) 1 % EX OINT: 1.0000 "application " | TOPICAL_OINTMENT | CUTANEOUS | Status: DC | PRN

## 2020-09-09 MED ORDER — SOD CITRATE-CITRIC ACID 500-334 MG/5ML PO SOLN
30.0000 mL | ORAL | Status: DC | PRN
  Administered 2020-09-09: 30 mL via ORAL
  Filled 2020-09-09: qty 30

## 2020-09-09 MED ORDER — SIMETHICONE 80 MG PO CHEW: 80.0000 mg | CHEWABLE_TABLET | ORAL | Status: DC | PRN

## 2020-09-09 MED ORDER — LACTATED RINGERS IV SOLN: 500.0000 mL | INTRAVENOUS | Status: DC | PRN

## 2020-09-09 MED ORDER — COCONUT OIL OIL
1.0000 "application " | TOPICAL_OIL | Status: DC | PRN
  Administered 2020-09-10: 1 via TOPICAL

## 2020-09-09 MED ORDER — WITCH HAZEL-GLYCERIN EX PADS: 1.0000 "application " | MEDICATED_PAD | CUTANEOUS | Status: DC | PRN

## 2020-09-09 MED ORDER — LIDOCAINE HCL (PF) 1 % IJ SOLN: 30.0000 mL | INTRAMUSCULAR | Status: DC | PRN

## 2020-09-09 MED ORDER — ONDANSETRON HCL 4 MG/2ML IJ SOLN: 4.0000 mg | INTRAMUSCULAR | Status: DC | PRN

## 2020-09-09 MED ORDER — MISOPROSTOL 50MCG HALF TABLET
ORAL_TABLET | ORAL | Status: AC
  Filled 2020-09-09: qty 1

## 2020-09-09 MED ORDER — ZOLPIDEM TARTRATE 5 MG PO TABS: 5.0000 mg | ORAL_TABLET | Freq: Every evening | ORAL | Status: DC | PRN

## 2020-09-09 MED ORDER — OXYCODONE HCL 5 MG PO TABS: 5.0000 mg | ORAL_TABLET | ORAL | Status: DC | PRN

## 2020-09-09 MED ORDER — FENTANYL-BUPIVACAINE-NACL 0.5-0.125-0.9 MG/250ML-% EP SOLN
12.0000 mL/h | EPIDURAL | Status: DC | PRN
  Administered 2020-09-09: 12 mL/h via EPIDURAL
  Filled 2020-09-09: qty 250

## 2020-09-09 MED ORDER — OXYCODONE HCL 5 MG PO TABS: 10.0000 mg | ORAL_TABLET | ORAL | Status: DC | PRN

## 2020-09-09 MED ORDER — PRENATAL MULTIVITAMIN CH
1.0000 | ORAL_TABLET | Freq: Every day | ORAL | Status: DC
  Administered 2020-09-10: 1 via ORAL
  Filled 2020-09-09: qty 1

## 2020-09-09 MED ORDER — ACETAMINOPHEN 325 MG PO TABS: 650.0000 mg | ORAL_TABLET | ORAL | Status: DC | PRN

## 2020-09-09 MED ORDER — OXYCODONE-ACETAMINOPHEN 5-325 MG PO TABS
2.0000 | ORAL_TABLET | ORAL | Status: DC | PRN
Start: 2020-09-09 — End: 2020-09-09

## 2020-09-09 MED ORDER — MEASLES, MUMPS & RUBELLA VAC IJ SOLR: 0.5000 mL | Freq: Once | INTRAMUSCULAR | Status: DC

## 2020-09-09 MED ORDER — DIPHENHYDRAMINE HCL 50 MG/ML IJ SOLN
12.5000 mg | INTRAMUSCULAR | Status: DC | PRN
Start: 2020-09-09 — End: 2020-09-09

## 2020-09-09 MED ORDER — OXYTOCIN-SODIUM CHLORIDE 30-0.9 UT/500ML-% IV SOLN: 1.0000 m[IU]/min | INTRAVENOUS | Status: DC

## 2020-09-09 MED ORDER — TRANEXAMIC ACID-NACL 1000-0.7 MG/100ML-% IV SOLN: 1000.0000 mg | Freq: Once | INTRAVENOUS | Status: DC

## 2020-09-09 MED ORDER — DIPHENHYDRAMINE HCL 25 MG PO CAPS: 25.0000 mg | ORAL_CAPSULE | Freq: Four times a day (QID) | ORAL | Status: DC | PRN

## 2020-09-09 MED ORDER — BENZOCAINE-MENTHOL 20-0.5 % EX AERO: 1.0000 "application " | INHALATION_SPRAY | CUTANEOUS | Status: DC | PRN

## 2020-09-09 MED ORDER — ERYTHROMYCIN 5 MG/GM OP OINT
TOPICAL_OINTMENT | OPHTHALMIC | Status: AC
  Filled 2020-09-09: qty 1

## 2020-09-09 MED ORDER — SENNOSIDES-DOCUSATE SODIUM 8.6-50 MG PO TABS
2.0000 | ORAL_TABLET | ORAL | Status: DC
  Administered 2020-09-10: 2 via ORAL
  Filled 2020-09-09: qty 2

## 2020-09-09 MED ORDER — TRANEXAMIC ACID-NACL 1000-0.7 MG/100ML-% IV SOLN
INTRAVENOUS | Status: AC
  Filled 2020-09-09: qty 100

## 2020-09-09 NOTE — Anesthesia Procedure Notes (Signed)
Epidural Patient location during procedure: OB Start time: 09/09/2020 6:38 PM End time: 09/09/2020 6:46 PM  Staffing Anesthesiologist: Mal Amabile, MD Performed: anesthesiologist   Preanesthetic Checklist Completed: patient identified, IV checked, site marked, risks and benefits discussed, surgical consent, monitors and equipment checked, pre-op evaluation and timeout performed  Epidural Patient position: sitting Prep: DuraPrep and site prepped and draped Patient monitoring: continuous pulse ox and blood pressure Approach: midline Location: L3-L4 Injection technique: LOR air  Needle:  Needle type: Tuohy  Needle gauge: 18 G Needle length: 9 cm and 9 Needle insertion depth: 4 cm Catheter type: closed end flexible Catheter size: 19 Gauge Catheter at skin depth: 9 cm Test dose: negative and Other  Assessment Events: blood not aspirated, injection not painful, no injection resistance, no paresthesia and negative IV test  Additional Notes Patient identified. Risks and benefits discussed including failed block, incomplete  Pain control, post dural puncture headache, nerve damage, paralysis, blood pressure Changes, nausea, vomiting, reactions to medications-both toxic and allergic and post Partum back pain. All questions were answered. Patient expressed understanding and wished to proceed. Sterile technique was used throughout procedure. Epidural site was Dressed with sterile barrier dressing. No paresthesias, signs of intravascular injection Or signs of intrathecal spread were encountered.  Patient was more comfortable after the epidural was dosed. Please see RN's note for documentation of vital signs and FHR which are stable.]Reason for block:procedure for pain

## 2020-09-09 NOTE — Lactation Note (Signed)
This note was copied from a baby's chart. Lactation Consultation Note  Patient Name: Brandi Mckenzie [id number] Date: 09/09/2020   Age:32 hours  LC talked with tech on first floor trying to reach RN in L &D. RN stated working with Mom at moment and prefer to see lactation once mother is on the floor.   Maternal Data    Feeding    LATCH Score                    Lactation Tools Discussed/Used    Interventions    Discharge    Consult Status      [name]  [name] 09/09/2020, 8:56 PM

## 2020-09-09 NOTE — Progress Notes (Signed)
Late entry s/s prenatal care. No updates to H&P except + at home covid test and on precautions.  SVE deferred - not yet due for next cytotec.

## 2020-09-09 NOTE — Progress Notes (Signed)
Feels well - no s/s severe PIH. BP 140s/90s SVE 1.5-2/60/-2, ballotable.  Placed 4th cytotec buccally FHT reassuring PIH labs reassuring

## 2020-09-09 NOTE — Anesthesia Preprocedure Evaluation (Addendum)
Anesthesia Evaluation  Patient identified by MRN, date of birth, ID band Patient awake    Reviewed: Allergy & Precautions, Patient's Chart, lab work & pertinent test results  Airway Mallampati: II  TM Distance: >3 FB Neck ROM: Full    Dental no notable dental hx. (+) Teeth Intact   Pulmonary  Covid 19 + at home test, on precautions   Pulmonary exam normal breath sounds clear to auscultation       Cardiovascular hypertension, Normal cardiovascular exam Rhythm:Regular Rate:Normal     Neuro/Psych negative neurological ROS  negative psych ROS   GI/Hepatic Neg liver ROS, GERD  Medicated and Controlled,  Endo/Other  negative endocrine ROS  Renal/GU negative Renal ROS  negative genitourinary   Musculoskeletal negative musculoskeletal ROS (+)   Abdominal   Peds  Hematology negative hematology ROS (+)   Anesthesia Other Findings   Reproductive/Obstetrics (+) Pregnancy PIH                            Anesthesia Physical Anesthesia Plan  ASA: 2  Anesthesia Plan: Epidural   Post-op Pain Management:    Induction:   PONV Risk Score and Plan:   Airway Management Planned: Natural Airway  Additional Equipment:   Intra-op Plan:   Post-operative Plan:   Informed Consent: I have reviewed the patients History and Physical, chart, labs and discussed the procedure including the risks, benefits and alternatives for the proposed anesthesia with the patient or authorized representative who has indicated his/her understanding and acceptance.       Plan Discussed with: Anesthesiologist  Anesthesia Plan Comments:         Anesthesia Quick Evaluation

## 2020-09-09 NOTE — Lactation Note (Signed)
This note was copied from a baby's chart. Lactation Consultation Note  Patient Name: Brandi Mckenzie [id number] Date: 09/09/2020 Reason for consult: Initial assessment;Mother's request;Primapara;1st time breastfeeding;Difficult latch;Early term 37-38.6wks;Other (Comment) (PIH) Age:32 hours  LC assisted latching infant with depth in prone position.   Mom short shafted small nipples. Mom manual pump to pre pump 5-10 min before latching.   Plan 1. To feed based on cues 8-12x 24 hr period  2. Mom to offer breasts and look for signs of milk transfer.  3. If unable to latch, mom to offer eBM via spoon feeding. 4. I and O sheet reviewed.  5. LC brochure of inpatient and outpatient services reviewed.  All questions answered at the end of the visit.   Maternal Data Has patient been taught Hand Expression?: Yes  Feeding Mother's Current Feeding Choice: Breast Milk  LATCH Score Latch: Repeated attempts needed to sustain latch, nipple held in mouth throughout feeding, stimulation needed to elicit sucking reflex.  Audible Swallowing: A few with stimulation  Type of Nipple: Everted at rest and after stimulation  Comfort (Breast/Nipple): Soft / non-tender  Hold (Positioning): Assistance needed to correctly position infant at breast and maintain latch.  LATCH Score: 7   Lactation Tools Discussed/Used    Interventions Interventions: Breast feeding basics reviewed;Breast compression;Assisted with latch;Adjust position;Skin to skin;Support pillows;Breast massage;Position options;Hand express;Expressed milk;Education  Discharge    Consult Status Consult Status: Follow-up Date: 09/10/20 Follow-up type: In-patient    Brandi Oriordan  Mckenzie 09/09/2020, 11:32 PM

## 2020-09-10 LAB — CBC
HCT: 32 % — ABNORMAL LOW (ref 36.0–46.0)
Hemoglobin: 11.1 g/dL — ABNORMAL LOW (ref 12.0–15.0)
MCH: 31.5 pg (ref 26.0–34.0)
MCHC: 34.7 g/dL (ref 30.0–36.0)
MCV: 90.9 fL (ref 80.0–100.0)
Platelets: 217 10*3/uL (ref 150–400)
RBC: 3.52 MIL/uL — ABNORMAL LOW (ref 3.87–5.11)
RDW: 13 % (ref 11.5–15.5)
WBC: 16.7 10*3/uL — ABNORMAL HIGH (ref 4.0–10.5)
nRBC: 0 % (ref 0.0–0.2)

## 2020-09-10 NOTE — Progress Notes (Signed)
Called Dr. Elon Spanner regarding pt's BP 133/98 (right arm), 122/87 (left arm). Parameter given to call for BP >150/100. Also inquired about pt's report that her isolation for COVID would be up tomorrow 09/11/20. Only documentation noted is pt message to [facility] office 09/02/2020 and + result in hospital 09/09/2020. Per Dr. Elon Spanner, pt will discharge home tomorrow.

## 2020-09-10 NOTE — Progress Notes (Signed)
Post Partum Day 1 Subjective: no complaints, up ad lib, voiding, and tolerating PO  Objective: Blood pressure 129/85, pulse 75, temperature 98.5 F (36.9 C), temperature source Oral, resp. rate 18, height 5' 8"  (1.727 m), weight 88.2 kg, SpO2 99 %.  Physical Exam:  General: alert Lochia: appropriate Uterine Fundus: firm Incision: N/A DVT Evaluation: No evidence of DVT seen on physical exam.  Recent Labs    09/09/20 0058 09/09/20 1739  HGB 13.0 13.4  HCT 38.2 40.4    Assessment/Plan: Plan for discharge tomorrow and Breastfeeding. MMR ordered for rubella non-immune.   LOS: 1 day   Tyson Dense 09/10/2020, 5:24 AM

## 2020-09-10 NOTE — Anesthesia Postprocedure Evaluation (Signed)
Anesthesia Post Note  Patient: Brandi Mckenzie  Procedure(s) Performed: AN AD HOC LABOR EPIDURAL     Patient location during evaluation: Mother Baby Anesthesia Type: Epidural Level of consciousness: awake and alert Pain management: pain level controlled Vital Signs Assessment: post-procedure vital signs reviewed and stable Respiratory status: spontaneous breathing, nonlabored ventilation and respiratory function stable Cardiovascular status: stable Postop Assessment: no headache, no backache and epidural receding Anesthetic complications: no   No notable events documented.  Last Vitals:  Vitals:   09/10/20 0741 09/10/20 1114  BP: 137/89 132/83  Pulse: 77 81  Resp:  18  Temp: 36.7 C 36.7 C  SpO2:      Last Pain:  Vitals:   09/10/20 1200  TempSrc:   PainSc: 0-No pain   Pain Goal:                   [facility] Records

## 2020-09-11 ENCOUNTER — Encounter (HOSPITAL_COMMUNITY): Payer: Self-pay | Admitting: Obstetrics and Gynecology

## 2020-09-11 NOTE — Progress Notes (Signed)
RN gave pt MMR vis statement and asked she had discussed mmr in the office w MD. Pt states that she did, but she will follow up with vaccines outpatient. 

## 2020-09-11 NOTE — Discharge Summary (Signed)
Postpartum Discharge Summary     Patient Name: Brandi Mckenzie DOB: 1990/02/20 MRN: [id number]  Date of admission: 09/09/2020 Delivery date:09/09/2020  Delivering provider: Tyson Dense  Date of discharge: 09/11/2020  Admitting diagnosis: Pregnancy induced hypertension [O13.9] Intrauterine pregnancy: [redacted]w[redacted]d    Secondary diagnosis:  Active Problems:   Pregnancy induced hypertension  Additional problems: none    Discharge diagnosis: Term Pregnancy Delivered and Gestational Hypertension                                              Post partum procedures: none Augmentation: AROM, Pitocin, and Cytotec Complications: None  Hospital course: Induction of Labor With Vaginal Delivery   30y.o. yo G1P1001 at 327w5das admitted to the hospital 09/09/2020 for induction of labor.  Indication for induction: Gestational hypertension.  Patient had an uncomplicated labor course as follows: Membrane Rupture Time/Date: 5:20 PM ,09/09/2020   Delivery Method:Vaginal, Spontaneous  Episiotomy: None  Lacerations:  1st degree  Details of delivery can be found in separate delivery note.  Patient had a routine postpartum course. Patient is discharged home 09/11/20.  Newborn Data: Birth date:09/09/2020  Birth time:7:50 PM  Gender:Female  Living status:Living  Apgars:9 ,10  Weight:2909 g   Magnesium Sulfate received: No BMZ received: No Rhophylac:N/A MMR:Yes T-DaP:Given prenatally Flu: N/A Transfusion:No  Physical exam  Vitals:   09/10/20 1114 09/10/20 2143 09/10/20 2144 09/11/20 0523  BP: 132/83 (!) 133/98 122/87 129/90  Pulse: 81 73  75  Resp: 18 18  18   Temp: 98 F (36.7 C) 97.9 F (36.6 C)  97.9 F (36.6 C)  TempSrc:  Oral  Oral  SpO2:  98%  99%  Weight:      Height:       General: alert Lochia: appropriate Uterine Fundus: firm Incision: N/A DVT Evaluation: No evidence of DVT seen on physical exam. Labs: Lab Results  Component Value Date   WBC 16.7 (H) 09/10/2020   HGB 11.1  (L) 09/10/2020   HCT 32.0 (L) 09/10/2020   MCV 90.9 09/10/2020   PLT 217 09/10/2020   CMP Latest Ref Rng & Units 09/09/2020  Glucose 70 - 99 mg/dL 80  BUN 6 - 20 mg/dL 12  Creatinine 0.44 - 1.00 mg/dL 0.62  Sodium 135 - 145 mmol/L 132(L)  Potassium 3.5 - 5.1 mmol/L 4.4  Chloride 98 - 111 mmol/L 104  CO2 22 - 32 mmol/L 19(L)  Calcium 8.9 - 10.3 mg/dL 9.9  Total Protein 6.5 - 8.1 g/dL 5.7(L)  Total Bilirubin 0.3 - 1.2 mg/dL 0.4  Alkaline Phos 38 - 126 U/L 90  AST 15 - 41 U/L 18  ALT 0 - 44 U/L 13   Edinburgh Score: Edinburgh Postnatal Depression Scale Screening Tool 09/10/2020  I have been able to laugh and see the funny side of things. 0  I have looked forward with enjoyment to things. 0  I have blamed myself unnecessarily when things went wrong. 0  I have been anxious or worried for no good reason. 1  I have felt scared or panicky for no good reason. 0  Things have been getting on top of me. 0  I have been so unhappy that I have had difficulty sleeping. 0  I have felt sad or miserable. 0  I have been so unhappy that I have been crying. 0  The  thought of harming myself has occurred to me. 0  Edinburgh Postnatal Depression Scale Total 1      After visit meds:  Allergies as of 09/11/2020       Reactions   Doxycycline Anaphylaxis   Amoxicillin Rash   Penicillins Rash        Medication List     STOP taking these medications    amphetamine-dextroamphetamine 15 MG tablet Commonly known as: ADDERALL       TAKE these medications    acetaminophen 500 MG tablet Commonly known as: TYLENOL Take 1,000 mg by mouth every 6 (six) hours as needed for mild pain.   calcium carbonate 500 MG chewable tablet Commonly known as: TUMS - dosed in mg elemental calcium Chew 2 tablets by mouth daily as needed for indigestion or heartburn.   polyethylene glycol 17 g packet Commonly known as: MIRALAX / GLYCOLAX Take 17 g by mouth daily as needed for mild constipation.   prenatal  multivitamin Tabs tablet Take 1 tablet by mouth daily at 12 noon.         Discharge home in stable condition Infant Feeding: Bottle and Breast Infant Disposition:home with mother Discharge instruction: per After Visit Summary and Postpartum booklet. Activity: Advance as tolerated. Pelvic rest for 6 weeks.  Diet: routine diet Anticipated Birth Control: Unsure Postpartum Appointment:2-3 days Additional Postpartum F/U: BP check 2-3 days Future Appointments:No future appointments. Follow up Visit:      09/11/2020 Tyson Dense, MD

## 2020-09-11 NOTE — Progress Notes (Signed)
CSW spoke with MOB via room [id number] to complete consult for mental health. CSW explained role, and reason for consult. MOB was pleasant, and polite during engagement with CSW. MOB reported, history of anxiety, and ADHD in 2011-2012, when she was enrolled in college. MOB reported, during this time she was a [employer], and trying to balance school workload. MOB reported, history of Adderall, and Jan 2020 being her last dose of medication. MOB reported, she has been able to manage symptoms without medication needed. However, she may resume medication after breastfeeding. CSW encourage MOB to implement healthy coping skills when symptoms arises.   CSW provided education regarding the baby blues period vs. perinatal mood disorders, discussed treatment and gave resources for mental health follow up if concerns arise. CSW recommends self- evaluation during the postpartum time period using the New Mom Checklist from Postpartum Progress and encouraged MOB to contact a medical professional if symptoms are noted at any time.   MOB reported, since delivery she feels, "relieved". MOB reported, FOB, and her mother are very supportive. MOB denied SI, HI, and DV when CSW assessed for safety.   MOB reported, there are no barriers to follow up infant's care. MOB reported, she has all essentials needed to care for infant. MOB reported, infant has a car seat, and bassinet. MOB denied any additional barriers.     CSW provided education on sudden infant death syndrome (SIDS).  CSW identifies no further need for intervention or barriers to discharge at this time.  Dolores Frame, MSW, LCSW-A Clinical Social Worker- Weekends [phone]

## 2020-09-22 ENCOUNTER — Telehealth (HOSPITAL_COMMUNITY): Payer: Self-pay | Admitting: *Deleted

## 2020-09-22 NOTE — Telephone Encounter (Signed)
Left message to return nurse call.  Duffy Rhody, RN 09-22-2020 at 12:00pm

## 2022-01-16 ENCOUNTER — Other Ambulatory Visit: Payer: Self-pay | Admitting: Obstetrics & Gynecology

## 2022-01-16 DIAGNOSIS — N6310 Unspecified lump in the right breast, unspecified quadrant: Secondary | ICD-10-CM

## 2022-01-19 ENCOUNTER — Ambulatory Visit
Admission: RE | Admit: 2022-01-19 | Discharge: 2022-01-19 | Disposition: A | Discharge status: Home / Self Care | Payer: BC Managed Care – PPO | Source: Ambulatory Visit | Attending: Obstetrics & Gynecology | Admitting: Obstetrics & Gynecology

## 2022-01-19 DIAGNOSIS — N6310 Unspecified lump in the right breast, unspecified quadrant: Secondary | ICD-10-CM
